# Patient Record
Sex: Male | Born: 1960 | Race: White | Hispanic: No | Marital: Married | Smoking: Former smoker
Health system: Southern US, Community
[De-identification: ages and names within clinical notes are randomized; demographics above are authoritative.]

## PROBLEM LIST (undated history)

## (undated) DIAGNOSIS — R202 Paresthesia of skin: Secondary | ICD-10-CM

## (undated) DIAGNOSIS — R200 Anesthesia of skin: Secondary | ICD-10-CM

## (undated) HISTORY — PX: SHOULDER ARTHROSCOPY: SHX128

---

## 2003-04-24 ENCOUNTER — Encounter: Admission: RE | Admit: 2003-04-24 | Discharge: 2003-04-24 | Payer: Self-pay | Admitting: Sports Medicine

## 2005-06-13 ENCOUNTER — Emergency Department (HOSPITAL_COMMUNITY): Admission: EM | Admit: 2005-06-13 | Discharge: 2005-06-13 | Payer: Self-pay | Admitting: Emergency Medicine

## 2015-05-23 ENCOUNTER — Other Ambulatory Visit: Payer: Self-pay | Admitting: Orthopaedic Surgery

## 2015-05-23 DIAGNOSIS — M25512 Pain in left shoulder: Secondary | ICD-10-CM

## 2015-06-02 ENCOUNTER — Ambulatory Visit
Admission: RE | Admit: 2015-06-02 | Discharge: 2015-06-02 | Disposition: A | Discharge status: Home / Self Care | Payer: BLUE CROSS/BLUE SHIELD | Source: Ambulatory Visit | Attending: Orthopaedic Surgery | Admitting: Orthopaedic Surgery

## 2015-06-02 DIAGNOSIS — M25512 Pain in left shoulder: Secondary | ICD-10-CM

## 2017-10-24 ENCOUNTER — Other Ambulatory Visit: Payer: Self-pay | Admitting: Urology

## 2017-10-24 DIAGNOSIS — C61 Malignant neoplasm of prostate: Secondary | ICD-10-CM

## 2017-11-08 ENCOUNTER — Encounter (HOSPITAL_COMMUNITY)
Admission: RE | Admit: 2017-11-08 | Discharge: 2017-11-08 | Disposition: A | Discharge status: Home / Self Care | Payer: No Typology Code available for payment source | Source: Ambulatory Visit | Attending: Urology | Admitting: Urology

## 2017-11-08 DIAGNOSIS — C61 Malignant neoplasm of prostate: Secondary | ICD-10-CM

## 2017-11-08 MED ORDER — TECHNETIUM TC 99M MEDRONATE IV KIT
19.5000 | PACK | Freq: Once | INTRAVENOUS | Status: AC | PRN
  Administered 2017-11-08: 19.5 via INTRAVENOUS

## 2017-11-23 ENCOUNTER — Other Ambulatory Visit: Payer: Self-pay | Admitting: Urology

## 2018-01-04 NOTE — Patient Instructions (Addendum)
SALEM LEMBKE  01/04/2018   Your procedure is scheduled on: 01-11-18   Report to [facility] Main  Entrance    Report to admitting at 5:30AM    Call this number if you have problems the morning of surgery [phone]     Remember: Do not eat food or drink liquids :After Midnight.     Take these medicines the morning of surgery with A SIP OF WATER: none                                You may not have any metal on your body including hair pins and              piercings  Do not wear jewelry, make-up, lotions, powders or perfumes, deodorant                           Men may shave face and neck.   Do not bring valuables to the hospital. [place].  Contacts, dentures or bridgework may not be worn into surgery.  Leave suitcase in the car. After surgery it may be brought to your room.                 Please read over the following fact sheets you were given: _____________________________________________________________________             [facility] - Preparing for Surgery Before surgery, you can play an important role.  Because skin is not sterile, your skin needs to be as free of germs as possible.  You can reduce the number of germs on your skin by washing with CHG (chlorahexidine gluconate) soap before surgery.  CHG is an antiseptic cleaner which kills germs and bonds with the skin to continue killing germs even after washing. Please DO NOT use if you have an allergy to CHG or antibacterial soaps.  If your skin becomes reddened/irritated stop using the CHG and inform your nurse when you arrive at Short Stay. Do not shave (including legs and underarms) for at least 48 hours prior to the first CHG shower.  You may shave your face/neck. Please follow these instructions carefully:  1.  Shower with CHG Soap the night before surgery and the  morning of Surgery.  2.  If you choose to wash your hair, wash your  hair first as usual with your  normal  shampoo.  3.  After you shampoo, rinse your hair and body thoroughly to remove the  shampoo.                           4.  Use CHG as you would any other liquid soap.  You can apply chg directly  to the skin and wash                       Gently with a scrungie or clean washcloth.  5.  Apply the CHG Soap to your body ONLY FROM THE NECK DOWN.   Do not use on face/ open  Wound or open sores. Avoid contact with eyes, ears mouth and genitals (private parts).                       Wash face,  Genitals (private parts) with your normal soap.             6.  Wash thoroughly, paying special attention to the area where your surgery  will be performed.  7.  Thoroughly rinse your body with warm water from the neck down.  8.  DO NOT shower/wash with your normal soap after using and rinsing off  the CHG Soap.                9.  Pat yourself dry with a clean towel.            10.  Wear clean pajamas.            11.  Place clean sheets on your bed the night of your first shower and do not  sleep with pets. Day of Surgery : Do not apply any lotions/deodorants the morning of surgery.  Please wear clean clothes to the hospital/surgery center.  FAILURE TO FOLLOW THESE INSTRUCTIONS MAY RESULT IN THE CANCELLATION OF YOUR SURGERY PATIENT SIGNATURE_________________________________  NURSE SIGNATURE__________________________________  ________________________________________________________________________

## 2018-01-05 ENCOUNTER — Encounter (HOSPITAL_COMMUNITY)
Admission: RE | Admit: 2018-01-05 | Discharge: 2018-01-05 | Disposition: A | Discharge status: Home / Self Care | Payer: No Typology Code available for payment source | Source: Ambulatory Visit | Attending: Urology | Admitting: Urology

## 2018-01-05 ENCOUNTER — Other Ambulatory Visit: Payer: Self-pay

## 2018-01-05 ENCOUNTER — Encounter (HOSPITAL_COMMUNITY): Payer: Self-pay

## 2018-01-05 DIAGNOSIS — Z01812 Encounter for preprocedural laboratory examination: Secondary | ICD-10-CM | POA: Diagnosis not present

## 2018-01-05 HISTORY — DX: Anesthesia of skin: R20.0

## 2018-01-05 HISTORY — DX: Paresthesia of skin: R20.2

## 2018-01-05 LAB — COMPREHENSIVE METABOLIC PANEL
ALBUMIN: 4.6 g/dL (ref 3.5–5.0)
ALK PHOS: 58 U/L (ref 38–126)
ALT: 27 U/L (ref 17–63)
AST: 25 U/L (ref 15–41)
Anion gap: 8 (ref 5–15)
BILIRUBIN TOTAL: 0.5 mg/dL (ref 0.3–1.2)
BUN: 14 mg/dL (ref 6–20)
CALCIUM: 9.8 mg/dL (ref 8.9–10.3)
CO2: 27 mmol/L (ref 22–32)
CREATININE: 0.96 mg/dL (ref 0.61–1.24)
Chloride: 106 mmol/L (ref 101–111)
GFR calc Af Amer: >60 mL/min (ref >60)
GFR calc non Af Amer: >60 mL/min (ref >60)
GLUCOSE: 99 mg/dL (ref 65–99)
Potassium: 4.3 mmol/L (ref 3.5–5.1)
Sodium: 141 mmol/L (ref 135–145)
Total Protein: 7.2 g/dL (ref 6.5–8.1)

## 2018-01-05 LAB — CBC
HEMATOCRIT: 42.1 % (ref 39.0–52.0)
HEMOGLOBIN: 14.4 g/dL (ref 13.0–17.0)
MCH: 31 pg (ref 26.0–34.0)
MCHC: 34.2 g/dL (ref 30.0–36.0)
MCV: 90.5 fL (ref 78.0–100.0)
Platelets: 219 10*3/uL (ref 150–400)
RBC: 4.65 MIL/uL (ref 4.22–5.81)
RDW: 13.5 % (ref 11.5–15.5)
WBC: 6 10*3/uL (ref 4.0–10.5)

## 2018-01-05 LAB — ABO/RH: ABO/RH(D): O NEG

## 2018-01-06 LAB — URINE CULTURE: Culture: NO GROWTH

## 2018-01-10 NOTE — Anesthesia Preprocedure Evaluation (Signed)
Anesthesia Evaluation  Patient identified by MRN, date of birth, ID band Patient awake    Reviewed: Allergy & Precautions, H&P , NPO status , Patient's Chart, lab work & pertinent test results, reviewed documented beta blocker date and time   Airway Mallampati: II  TM Distance: >3 FB Neck ROM: full    Dental no notable dental hx.    Pulmonary former smoker,    Pulmonary exam normal breath sounds clear to auscultation       Cardiovascular Exercise Tolerance: Good negative cardio ROS   Rhythm:regular Rate:Normal     Neuro/Psych    GI/Hepatic   Endo/Other    Renal/GU      Musculoskeletal   Abdominal   Peds  Hematology   Anesthesia Other Findings   Reproductive/Obstetrics negative OB ROS                             Anesthesia Physical Anesthesia Plan  ASA: II  Anesthesia Plan: General   Post-op Pain Management:    Induction:   PONV Risk Score and Plan:   Airway Management Planned:   Additional Equipment:   Intra-op Plan:   Post-operative Plan:   Informed Consent: I have reviewed the patients History and Physical, chart, labs and discussed the procedure including the risks, benefits and alternatives for the proposed anesthesia with the patient or authorized representative who has indicated his/her understanding and acceptance.   Dental Advisory Given  Plan Discussed with: CRNA  Anesthesia Plan Comments:         Anesthesia Quick Evaluation

## 2018-01-11 ENCOUNTER — Observation Stay (HOSPITAL_COMMUNITY)
Admission: RE | Admit: 2018-01-11 | Discharge: 2018-01-12 | Disposition: A | Discharge status: Home / Self Care | Payer: No Typology Code available for payment source | Source: Ambulatory Visit | Attending: Urology | Admitting: Urology

## 2018-01-11 ENCOUNTER — Encounter (HOSPITAL_COMMUNITY): Payer: Self-pay | Admitting: Urology

## 2018-01-11 ENCOUNTER — Ambulatory Visit (HOSPITAL_COMMUNITY): Payer: No Typology Code available for payment source | Admitting: Anesthesiology

## 2018-01-11 ENCOUNTER — Encounter (HOSPITAL_COMMUNITY)
Admission: RE | Disposition: A | Discharge status: Home / Self Care | Payer: Self-pay | Source: Ambulatory Visit | Attending: Urology

## 2018-01-11 DIAGNOSIS — C61 Malignant neoplasm of prostate: Secondary | ICD-10-CM | POA: Diagnosis not present

## 2018-01-11 DIAGNOSIS — Z87891 Personal history of nicotine dependence: Secondary | ICD-10-CM | POA: Insufficient documentation

## 2018-01-11 DIAGNOSIS — R972 Elevated prostate specific antigen [PSA]: Secondary | ICD-10-CM | POA: Diagnosis present

## 2018-01-11 DIAGNOSIS — C775 Secondary and unspecified malignant neoplasm of intrapelvic lymph nodes: Secondary | ICD-10-CM | POA: Diagnosis not present

## 2018-01-11 HISTORY — PX: ROBOT ASSISTED LAPAROSCOPIC RADICAL PROSTATECTOMY: SHX5141

## 2018-01-11 HISTORY — PX: LYMPHADENECTOMY: SHX5960

## 2018-01-11 LAB — HEMOGLOBIN AND HEMATOCRIT, BLOOD
HCT: 42.9 % (ref 39.0–52.0)
HEMOGLOBIN: 14.3 g/dL (ref 13.0–17.0)

## 2018-01-11 LAB — TYPE AND SCREEN
ABO/RH(D): O NEG
Antibody Screen: NEGATIVE

## 2018-01-11 SURGERY — PROSTATECTOMY, RADICAL, ROBOT-ASSISTED, LAPAROSCOPIC
Anesthesia: General

## 2018-01-11 MED ORDER — PROPOFOL 10 MG/ML IV BOLUS
INTRAVENOUS | Status: AC
  Filled 2018-01-11: qty 20

## 2018-01-11 MED ORDER — MEPERIDINE HCL 50 MG/ML IJ SOLN
INTRAMUSCULAR | Status: AC
  Administered 2018-01-11: 12.5 mg via INTRAVENOUS
  Filled 2018-01-11: qty 1

## 2018-01-11 MED ORDER — ACETAMINOPHEN 160 MG/5ML PO SOLN: 325.0000 mg | ORAL | Status: DC | PRN

## 2018-01-11 MED ORDER — SODIUM CHLORIDE 0.9 % IJ SOLN
INTRAMUSCULAR | Status: AC
  Filled 2018-01-11: qty 50

## 2018-01-11 MED ORDER — BUPIVACAINE-EPINEPHRINE 0.5% -1:200000 IJ SOLN
INTRAMUSCULAR | Status: DC | PRN
  Administered 2018-01-11: 30 mL

## 2018-01-11 MED ORDER — ONDANSETRON HCL 4 MG/2ML IJ SOLN
INTRAMUSCULAR | Status: DC | PRN
  Administered 2018-01-11: 4 mg via INTRAVENOUS

## 2018-01-11 MED ORDER — TRAMADOL HCL 50 MG PO TABS: 50.0000 mg | ORAL_TABLET | Freq: Four times a day (QID) | ORAL | 0 refills | Status: DC | PRN

## 2018-01-11 MED ORDER — PROPOFOL 10 MG/ML IV BOLUS
INTRAVENOUS | Status: DC | PRN
  Administered 2018-01-11: 40 mg via INTRAVENOUS
  Administered 2018-01-11: 160 mg via INTRAVENOUS

## 2018-01-11 MED ORDER — FENTANYL CITRATE (PF) 250 MCG/5ML IJ SOLN
INTRAMUSCULAR | Status: AC
  Filled 2018-01-11: qty 5

## 2018-01-11 MED ORDER — FENTANYL CITRATE (PF) 100 MCG/2ML IJ SOLN
INTRAMUSCULAR | Status: DC | PRN
  Administered 2018-01-11: 25 ug via INTRAVENOUS
  Administered 2018-01-11 (×2): 50 ug via INTRAVENOUS
  Administered 2018-01-11: 25 ug via INTRAVENOUS
  Administered 2018-01-11 (×3): 50 ug via INTRAVENOUS

## 2018-01-11 MED ORDER — LACTATED RINGERS IV SOLN
INTRAVENOUS | Status: DC | PRN
  Administered 2018-01-11 (×2): via INTRAVENOUS

## 2018-01-11 MED ORDER — DIPHENHYDRAMINE HCL 12.5 MG/5ML PO ELIX: 12.5000 mg | ORAL_SOLUTION | Freq: Four times a day (QID) | ORAL | Status: DC | PRN

## 2018-01-11 MED ORDER — BUPIVACAINE LIPOSOME 1.3 % IJ SUSP
20.0000 mL | Freq: Once | INTRAMUSCULAR | Status: AC
  Administered 2018-01-11: 20 mL
  Filled 2018-01-11: qty 20

## 2018-01-11 MED ORDER — MIDAZOLAM HCL 5 MG/5ML IJ SOLN
INTRAMUSCULAR | Status: DC | PRN
  Administered 2018-01-11: 2 mg via INTRAVENOUS

## 2018-01-11 MED ORDER — ONDANSETRON HCL 4 MG/2ML IJ SOLN
INTRAMUSCULAR | Status: AC
  Filled 2018-01-11: qty 2

## 2018-01-11 MED ORDER — BUPIVACAINE-EPINEPHRINE 0.5% -1:200000 IJ SOLN
INTRAMUSCULAR | Status: AC
  Filled 2018-01-11: qty 1

## 2018-01-11 MED ORDER — DEXAMETHASONE SODIUM PHOSPHATE 10 MG/ML IJ SOLN
INTRAMUSCULAR | Status: DC | PRN
  Administered 2018-01-11: 10 mg via INTRAVENOUS

## 2018-01-11 MED ORDER — FENTANYL CITRATE (PF) 100 MCG/2ML IJ SOLN
INTRAMUSCULAR | Status: AC
  Filled 2018-01-11: qty 2

## 2018-01-11 MED ORDER — ACETAMINOPHEN 10 MG/ML IV SOLN
1000.0000 mg | Freq: Four times a day (QID) | INTRAVENOUS | Status: AC
  Administered 2018-01-11 – 2018-01-12 (×3): 1000 mg via INTRAVENOUS
  Filled 2018-01-11 (×4): qty 100

## 2018-01-11 MED ORDER — SODIUM CHLORIDE 0.9 % IV BOLUS: 1000.0000 mL | Freq: Once | INTRAVENOUS | Status: DC

## 2018-01-11 MED ORDER — ACETAMINOPHEN 325 MG PO TABS: 650.0000 mg | ORAL_TABLET | ORAL | Status: DC | PRN

## 2018-01-11 MED ORDER — ACETAMINOPHEN 325 MG PO TABS: 325.0000 mg | ORAL_TABLET | ORAL | Status: DC | PRN

## 2018-01-11 MED ORDER — LIDOCAINE 2% (20 MG/ML) 5 ML SYRINGE
INTRAMUSCULAR | Status: AC
  Filled 2018-01-11: qty 5

## 2018-01-11 MED ORDER — STERILE WATER FOR IRRIGATION IR SOLN
Status: DC | PRN
  Administered 2018-01-11: 1000 mL

## 2018-01-11 MED ORDER — SULFAMETHOXAZOLE-TRIMETHOPRIM 800-160 MG PO TABS: 1.0000 | ORAL_TABLET | Freq: Two times a day (BID) | ORAL | 0 refills | Status: DC

## 2018-01-11 MED ORDER — SUGAMMADEX SODIUM 200 MG/2ML IV SOLN
INTRAVENOUS | Status: DC | PRN
  Administered 2018-01-11: 160 mg via INTRAVENOUS

## 2018-01-11 MED ORDER — SUGAMMADEX SODIUM 200 MG/2ML IV SOLN
INTRAVENOUS | Status: AC
  Filled 2018-01-11: qty 2

## 2018-01-11 MED ORDER — DIPHENHYDRAMINE HCL 50 MG/ML IJ SOLN: 12.5000 mg | Freq: Four times a day (QID) | INTRAMUSCULAR | Status: DC | PRN

## 2018-01-11 MED ORDER — BUPIVACAINE-EPINEPHRINE (PF) 0.5% -1:200000 IJ SOLN
INTRAMUSCULAR | Status: AC
  Filled 2018-01-11: qty 30

## 2018-01-11 MED ORDER — LACTATED RINGERS IR SOLN
Status: DC | PRN
  Administered 2018-01-11: 1

## 2018-01-11 MED ORDER — ONDANSETRON HCL 4 MG/2ML IJ SOLN: 4.0000 mg | INTRAMUSCULAR | Status: DC | PRN

## 2018-01-11 MED ORDER — DEXTROSE-NACL 5-0.45 % IV SOLN
INTRAVENOUS | Status: DC
  Administered 2018-01-11 – 2018-01-12 (×3): via INTRAVENOUS

## 2018-01-11 MED ORDER — KETOROLAC TROMETHAMINE 15 MG/ML IJ SOLN
15.0000 mg | Freq: Four times a day (QID) | INTRAMUSCULAR | Status: DC
  Administered 2018-01-11 – 2018-01-12 (×5): 15 mg via INTRAVENOUS
  Filled 2018-01-11 (×5): qty 1

## 2018-01-11 MED ORDER — ROCURONIUM BROMIDE 10 MG/ML (PF) SYRINGE
PREFILLED_SYRINGE | INTRAVENOUS | Status: DC | PRN
  Administered 2018-01-11: 20 mg via INTRAVENOUS
  Administered 2018-01-11 (×3): 10 mg via INTRAVENOUS
  Administered 2018-01-11: 60 mg via INTRAVENOUS
  Administered 2018-01-11: 10 mg via INTRAVENOUS
  Administered 2018-01-11: 20 mg via INTRAVENOUS
  Administered 2018-01-11: 10 mg via INTRAVENOUS

## 2018-01-11 MED ORDER — CEFAZOLIN SODIUM-DEXTROSE 2-4 GM/100ML-% IV SOLN
INTRAVENOUS | Status: AC
  Filled 2018-01-11: qty 100

## 2018-01-11 MED ORDER — FENTANYL CITRATE (PF) 100 MCG/2ML IJ SOLN
25.0000 ug | INTRAMUSCULAR | Status: DC | PRN
  Administered 2018-01-11: 50 ug via INTRAVENOUS

## 2018-01-11 MED ORDER — LACTATED RINGERS IV SOLN: INTRAVENOUS | Status: DC

## 2018-01-11 MED ORDER — ROCURONIUM BROMIDE 100 MG/10ML IV SOLN
INTRAVENOUS | Status: AC
  Filled 2018-01-11: qty 1

## 2018-01-11 MED ORDER — TRAMADOL HCL 50 MG PO TABS
50.0000 mg | ORAL_TABLET | Freq: Four times a day (QID) | ORAL | Status: DC | PRN
Start: 2018-01-11 — End: 2018-01-12
  Administered 2018-01-11 – 2018-01-12 (×2): 100 mg via ORAL
  Filled 2018-01-11 (×2): qty 2

## 2018-01-11 MED ORDER — ONDANSETRON HCL 4 MG/2ML IJ SOLN: 4.0000 mg | Freq: Once | INTRAMUSCULAR | Status: DC | PRN

## 2018-01-11 MED ORDER — OXYCODONE HCL 5 MG/5ML PO SOLN
5.0000 mg | Freq: Once | ORAL | Status: DC | PRN
  Filled 2018-01-11: qty 5

## 2018-01-11 MED ORDER — MEPERIDINE HCL 50 MG/ML IJ SOLN
6.2500 mg | INTRAMUSCULAR | Status: DC | PRN
  Administered 2018-01-11: 12.5 mg via INTRAVENOUS

## 2018-01-11 MED ORDER — CEFAZOLIN SODIUM-DEXTROSE 2-4 GM/100ML-% IV SOLN
2.0000 g | INTRAVENOUS | Status: AC
  Administered 2018-01-11 (×2): 2 g via INTRAVENOUS
  Filled 2018-01-11: qty 100

## 2018-01-11 MED ORDER — LIDOCAINE 2% (20 MG/ML) 5 ML SYRINGE
INTRAMUSCULAR | Status: DC | PRN
  Administered 2018-01-11: 80 mg via INTRAVENOUS

## 2018-01-11 MED ORDER — OXYCODONE HCL 5 MG PO TABS: 5.0000 mg | ORAL_TABLET | Freq: Once | ORAL | Status: DC | PRN

## 2018-01-11 MED ORDER — HYDROMORPHONE HCL 1 MG/ML IJ SOLN
0.5000 mg | INTRAMUSCULAR | Status: DC | PRN
  Administered 2018-01-11: 1 mg via INTRAVENOUS
  Filled 2018-01-11 (×2): qty 1

## 2018-01-11 MED ORDER — DEXAMETHASONE SODIUM PHOSPHATE 10 MG/ML IJ SOLN
INTRAMUSCULAR | Status: AC
  Filled 2018-01-11: qty 1

## 2018-01-11 MED ORDER — MIDAZOLAM HCL 2 MG/2ML IJ SOLN
INTRAMUSCULAR | Status: AC
  Filled 2018-01-11: qty 2

## 2018-01-11 SURGICAL SUPPLY — 52 items
CATH FOLEY 2WAY SLVR 18FR 30CC (CATHETERS) ×4 IMPLANT
CATH TIEMANN FOLEY 18FR 5CC (CATHETERS) ×4 IMPLANT
CHLORAPREP W/TINT 26ML (MISCELLANEOUS) ×4 IMPLANT
CLIP VESOLOCK LG 6/CT PURPLE (CLIP) ×16 IMPLANT
COVER SURGICAL LIGHT HANDLE (MISCELLANEOUS) ×4 IMPLANT
COVER TIP SHEARS 8 DVNC (MISCELLANEOUS) ×4 IMPLANT
COVER TIP SHEARS 8MM DA VINCI (MISCELLANEOUS) ×4
CUTTER ECHEON FLEX ENDO 45 340 (ENDOMECHANICALS) ×4 IMPLANT
DECANTER SPIKE VIAL GLASS SM (MISCELLANEOUS) ×4 IMPLANT
DERMABOND ADVANCED (GAUZE/BANDAGES/DRESSINGS) ×2
DERMABOND ADVANCED .7 DNX12 (GAUZE/BANDAGES/DRESSINGS) ×2 IMPLANT
DRAPE ARM DVNC X/XI (DISPOSABLE) ×8 IMPLANT
DRAPE COLUMN DVNC XI (DISPOSABLE) ×2 IMPLANT
DRAPE DA VINCI XI ARM (DISPOSABLE) ×8
DRAPE DA VINCI XI COLUMN (DISPOSABLE) ×2
DRAPE SURG IRRIG POUCH 19X23 (DRAPES) ×4 IMPLANT
DRSG TEGADERM 4X4.75 (GAUZE/BANDAGES/DRESSINGS) ×4 IMPLANT
ELECT REM PT RETURN 15FT ADLT (MISCELLANEOUS) ×4 IMPLANT
GAUZE SPONGE 2X2 8PLY STRL LF (GAUZE/BANDAGES/DRESSINGS) ×2 IMPLANT
GLOVE BIO SURGEON STRL SZ 6.5 (GLOVE) ×3 IMPLANT
GLOVE BIO SURGEONS STRL SZ 6.5 (GLOVE) ×1
GLOVE BIOGEL M STRL SZ7.5 (GLOVE) ×8 IMPLANT
GOWN STRL REUS W/TWL LRG LVL3 (GOWN DISPOSABLE) ×4 IMPLANT
GOWN STRL REUS W/TWL XL LVL3 (GOWN DISPOSABLE) ×8 IMPLANT
HOLDER FOLEY CATH W/STRAP (MISCELLANEOUS) ×4 IMPLANT
IRRIG SUCT STRYKERFLOW 2 WTIP (MISCELLANEOUS) ×4
IRRIGATION SUCT STRKRFLW 2 WTP (MISCELLANEOUS) ×2 IMPLANT
IV LACTATED RINGERS 1000ML (IV SOLUTION) ×4 IMPLANT
PACK ROBOT UROLOGY CUSTOM (CUSTOM PROCEDURE TRAY) ×4 IMPLANT
PAD POSITIONING PINK XL (MISCELLANEOUS) ×4 IMPLANT
PORT ACCESS TROCAR AIRSEAL 12 (TROCAR) ×2 IMPLANT
PORT ACCESS TROCAR AIRSEAL 5M (TROCAR) ×2
SEAL CANN UNIV 5-8 DVNC XI (MISCELLANEOUS) ×8 IMPLANT
SEAL XI 5MM-8MM UNIVERSAL (MISCELLANEOUS) ×8
SET TRI-LUMEN FLTR TB AIRSEAL (TUBING) ×4 IMPLANT
SOLUTION ELECTROLUBE (MISCELLANEOUS) ×4 IMPLANT
SPONGE GAUZE 2X2 STER 10/PKG (GAUZE/BANDAGES/DRESSINGS) ×2
STAPLE RELOAD 45 GRN (STAPLE) ×2 IMPLANT
STAPLE RELOAD 45MM GREEN (STAPLE) ×2
SUT ETHILON 3 0 PS 1 (SUTURE) ×4 IMPLANT
SUT MNCRL AB 4-0 PS2 18 (SUTURE) ×8 IMPLANT
SUT V-LOC BARB 180 2/0GR6 GS22 (SUTURE) ×4
SUT VIC AB 0 CT1 27 (SUTURE) ×2
SUT VIC AB 0 CT1 27XBRD ANTBC (SUTURE) ×2 IMPLANT
SUT VIC AB 2-0 SH 27 (SUTURE) ×2
SUT VIC AB 2-0 SH 27X BRD (SUTURE) ×2 IMPLANT
SUT VICRYL 0 UR6 27IN ABS (SUTURE) ×8 IMPLANT
SUT VLOC BARB 180 ABS3/0GR12 (SUTURE) ×8
SUTURE V-LC BRB 180 2/0GR6GS22 (SUTURE) ×2 IMPLANT
SUTURE VLOC BRB 180 ABS3/0GR12 (SUTURE) ×4 IMPLANT
TOWEL OR NON WOVEN STRL DISP B (DISPOSABLE) ×4 IMPLANT
WATER STERILE IRR 1000ML POUR (IV SOLUTION) ×4 IMPLANT

## 2018-01-11 NOTE — Anesthesia Postprocedure Evaluation (Signed)
Anesthesia Post Note  Patient: TAKERU BOSE  Procedure(s) Performed: XI ROBOTIC ASSISTED LAPAROSCOPIC RADICAL PROSTATECTOMY (N/A ) LYMPHADENECTOMY (Bilateral )     Patient location during evaluation: PACU Anesthesia Type: General Level of consciousness: awake and alert Pain management: pain level controlled Vital Signs Assessment: post-procedure vital signs reviewed and stable Respiratory status: spontaneous breathing, nonlabored ventilation, respiratory function stable and patient connected to nasal cannula oxygen Cardiovascular status: blood pressure returned to baseline and stable Postop Assessment: no apparent nausea or vomiting Anesthetic complications: no    Last Vitals:  Vitals:   01/11/18 1300 01/11/18 1315  BP: (!) 146/84 (!) 148/86  Pulse: 92 91  Resp: 12 11  Temp:  36.8 C  SpO2: 100% 100%    Last Pain:  Vitals:   01/11/18 1315  TempSrc:   PainSc: Asleep                 Natalyn Szymanowski

## 2018-01-11 NOTE — Plan of Care (Signed)

## 2018-01-11 NOTE — Op Note (Signed)
Preoperative diagnosis:  1. Prostate Cancer   Postoperative diagnosis:  1. same   Procedure: 1. Robotic assisted laparoscopic radical prostatectomy 2. Bilateral extended pelvic lymph node dissection  Surgeon: Ardis Hughs, MD First Assistant: Debbrah Alar, PA Resident Assistant: Case Clydene Laming, MD  Anesthesia: General  Complications: None  Intraoperative findings:  1. Non-nerve sparing 2. Extended node dissection including common/external/internal and obturator node packets bilaterally 3. Water tight bladder anastamosis  EBL: Minimal  Specimens:  #1.  Prostate and seminal vesicals #2.  Bilateral common iliac lymph nodes, external iliac lymph nodes, internal iliac lymph nodes, obturator lymph nodes.  Indication: Jeffery Herrera is a 57 y.o. patient with prostate cancer.  After reviewing the management options for treatment, he elected to proceed with the removal of his prostate. We have discussed the potential benefits and risks of the procedure, side effects of the proposed treatment, the likelihood of the patient achieving the goals of the procedure, and any potential problems that might occur during the procedure or recuperation. Informed consent has been obtained.  Description of procedure: An assistant was required for this surgical procedure.  The duties of the assistant included but were not limited to suctioning, passing suture, camera manipulation, retraction. This procedure would not be able to be performed without an Environmental consultant.  The patient was consented in the preoperative holding area. He was in brought back to the operating room placed the table in supine position. General anesthesia was then induced and endotracheal tube was inserted. He was then placed in dorsolithotomy position and placed in steep Trendelenburg. He was then prepped and draped in the routine sterile fashion. We, the first assistant and I, then began by making a 10 mm incision supraumbilical midline  incision the skin down through into the peritoneum. Then placed a 8 mm trocar. I then inflated the abdomen and inserted the 0 robotic lens. We then placed 2 additional a 8 millimeter trochars in the patient's left lower abdomen proximally 9 cm apart and 2 trochars on the patient's right lower abdomen, one was an 8 mm trocar and the one most lateral was a 12 mm trocar which was used as the assistant port. A 5 mm trocar was placed by triangulating the 2 right lateral ports as a second assistant port. These ports were all placed under visual guidance. Once the ports were noted to be satisfactory position the robot was docked. We started with the 0 lens, monopolar scissors in the right hand and the [place] forceps the left hand as well as a fenestrated grasper as the third arm on the left-hand side.   We, the first assistant and I,  began our dissection the posterior plane incising the peritoneum at the level of the vas deferens. Isolated the left vas deferens and dissected it proximally towards the spermatic cord for 5 cm prior to ligating it. Then used this as traction to isolate the left the seminal vesicle which was then undressed bluntly and completely dissected out, all vessels were cauterized with a combination of bipolar and the monopolar scissors. We then turned our attention to the right side and similarly dissected out the right vas deferens and seminal vesicle. Once the SVs had been freed, we turned our attention to the posterior plane and bluntly dissected the tissue between the rectum and the posterior wall of the prostate bluntly out towards the apex.    At this point the bladder was taken down starting at the urachal remnant with a combination of both blunt  dissection and sharp dissection using monopolar cautery the bladder was dropped down in the usual fashion to the medial umbilical ligaments laterally and the dorsal vein of the prostate anteriorly creating our space of Retzius. We then turned  our attention to the endopelvic fascia which was incised laterally starting on the patient's right-hand side the levator muscles were pushed off the prostate laterally up towards the dorsal vein complex on the right-hand side. This process was then repeated on the left-hand side and a nice notch was created for the dorsal vein. I then used a 9mm stapler to staple the dorsal vein.   We,the first assistant and I, then located the bladder neck at the vesicoprostatic junction and using the monopolar scissors dissected down through the perivesical tissues and the bladder neck down to the prostatic urethra. The catheter was then deflated and pulled through our urethral opening and then used to retract the prostate anteriorly for the posterior bladder neck dissection. Once through the bladder neck and into the posterior plane of the prostate, the SVs were brought through the opening. The left pedicle was then isolated and systematically ligated with Weck clips and scissors. The nerves were not spared.   I then came down through the dorsal venous complex anteriorly down to the membranous urethra using the monopolar. Once down to the urethra, it was transected sharply and the apex of the prostate was then dissected off the levator and rectourethralis muscles. Once the apex of the prostate had been dissected free we came back to the base of the prostate and bluntly push the rectum and nerve vascular bundle off the prostate the patient's left and used clips on the patient's right to free the prostate. Once the prostate was free it was placed off to the side. The pelvis was then irrigated with normal saline and noted to be relatively hemostatic.  Attention was then turned to the right pelvic sidewall. The fibrofatty tissue between the external iliac vein, confluence of the iliac vessels, hypogastric artery, and Cooper's ligament was dissected free from the pelvic sidewall with care to preserve the obturator nerve. Weck  clips were used for lymphostasis and hemostasis.  The dissection was carried proximally to the common iliac artery at the bifurcation.  Individual lymph node packets were sent from each segment as noted above.   An identical procedure was performed on the contralateral side and the lymphatic packets were removed for permanent pathologic analysis.  The prostate and both lymph node tissues were placed in the Endo Catch bag and the string brought to the 5 mm port.    The vesicourethral anastomosis was then completed with 2 interlocking 3-0 V. lock sutures running the anastomosis in the 6:00 position to the 12:00 position on each side and then tying it off on the top. The final catheter was then passed through the patient's urethra and into the bladder and 120 cc was instilled into the bladder to test the anastomosis. As there was no leak a 64 [place] Blake drain was passed through the left lateral port and placed around the vesicourethral anastomosis. A 12 mm assistant port on the right lateral side was then closed with 0 Vicryl with the help of the [employer] needle. The 12 mm midline infraumbilical incision was then extended another centimeter taken down and the fascia opened to remove the Endo Catch bag with the prostate specimen. The fascia was then closed with a 0 Vicryl and all skin ports were closed with 4-0 Monocryl in a  subcutaneous fashion. Dermabond glue was then applied to the incisions. The drain was then secured to the skin with a 0 nylon stitch and dressing applied.   At the end of the case all laps needles and sponges had been accounted for. There no immediate complications. The patient returned to the PACU in stable condition.

## 2018-01-11 NOTE — Discharge Instructions (Signed)

## 2018-01-11 NOTE — H&P (Signed)
Elevated PSA- Established Patient  HPI: Jeffery Herrera is a 57 year-old male established patient who is here for follow up for further evaluation of his elevated PSA.  The patient was last seen 12/18.   His last PSA was performed approximately 10/03/2017. The last PSA value was 12.4.   His previous PSA(s) are: 12/18: 9.7. The patient states he does not take 5 alpha reductase inhibitor medication.   The patient complains of recent onset or worsening of his lower urinary tract symptom(s) that include frequency, urgency, and nocturia. He takes Tamsulosin for his lower urinary tract symptoms.   The patient has not had a prostate MRI. He has not had a prostate biopsy done. The patient does not have a family history of prostate cancer.   Patient does Crossfit training and states that while on the medication this cause his blood pressure to get too low and cause dizziness, therefore he stopped flomax. However, he did notice a improvement in his urinary tract symptoms. Patient states he drinks a lot of fluid during the day which may contribute to his frequency.   Prostate cancer profile  Stage: T2c  PSA: 12.4  Biopsy Gleason 4+4 = 8 , 12 /12 cores positive:  Prostate volume: 42gm   Prostate cancer nomogram after radical prostatectomy:  OC- 20%  ECE- 84%  SVI-  LNI - 20%  PFS (surgery)- 22% (60yr); 14% (22yr)  CSFS (surgery) - 99% (10 years) 97% (15 years)   Pelvic CT scan 4/19: No evidence of locally advanced disease, lymphadenopathy, or bony metastasis.  Bone scan 4/19: Minimal contrast uptake on the right fourth posterior rib. No other areas of uptake.   Interval: The patient presents today for discussion of his prostate cancer. He was diagnosed with high-volume Gleason 8 prostate cancer. Fortunately his bone scan and pelvic CT scan were largely unremarkable. There was an area of minimal contrast uptake on the bone scan in the posterior right fourth rib. A chest x-ray is pending.   The  patient notes slight improvement with his voiding symptoms on Uroxatral, but he is tolerating this without lightheadedness. He noted that her symptoms on tamsulosin, but was not tolerating lightheadedness.   The patient has a past surgical history of bilateral shoulder reconstruction, skin cancer removal on his neck. He is otherwise quite fit with no abdominal surgeries. He takes no medications.     AUA Symptom Score: He never has the sensation of not emptying his bladder completely after finishing urinating. Less than 20% of the time he has to urinate again fewer than two hours after he has finished urinating. He does not have to stop and start again several times when he urinates. Less than 20% of the time he finds it difficult to postpone urination. He never has a weak urinary stream. He never has to push or strain to begin urination. He has to get up to urinate 2 times from the time he goes to bed until the time he gets up in the morning.   Calculated AUA Symptom Score: 4    ALLERGIES: None   MEDICATIONS: Tamsulosin Hcl 0.4 mg capsule 1 capsule PO Daily  Uroxatral 10 mg tablet, extended release 24 hr 1 tablet PO Daily     GU PSH: Prostate Needle Biopsy - 10/17/2017    NON-GU PSH: Shoulder Surgery (Unspecified), Bilateral Surgical Pathology, Gross And Microscopic Examination For Prostate Needle - 10/17/2017    GU PMH: Elevated PSA - 10/17/2017, - 08/16/2017 Urinary Obstruction (Stable) - 08/16/2017, -  06/29/2017 ED due to arterial insufficiency - 06/29/2017 Encounter for Prostate Cancer screening - 06/29/2017 Prostate nodule w/ LUTS - 06/29/2017    NON-GU PMH: None   FAMILY HISTORY: 1 Daughter - Daughter 2 sons - Son   SOCIAL HISTORY: Marital Status: Married Preferred Language: English; Ethnicity: Not Hispanic Or Latino; Race: White Current Smoking Status: Patient does not smoke anymore. Has not smoked since 06/19/2007. Smoked for 25 years. Smoked 1/2 pack per day.   Tobacco Use  Assessment Completed: Used Tobacco in last 30 days? Does drink.  Drinks 2 caffeinated drinks per day. Patient's occupation Civil engineer, contracting.    REVIEW OF SYSTEMS:    GU Review Male:   Patient reports frequent urination and burning/ pain with urination. Patient denies hard to postpone urination, get up at night to urinate, leakage of urine, stream starts and stops, trouble starting your stream, have to strain to urinate , erection problems, and penile pain.  Gastrointestinal (Upper):   Patient denies nausea, vomiting, and indigestion/ heartburn.  Gastrointestinal (Lower):   Patient denies diarrhea and constipation.  Constitutional:   Patient denies fever, night sweats, weight loss, and fatigue.  Skin:   Patient denies skin rash/ lesion and itching.  Eyes:   Patient denies blurred vision and double vision.  Ears/ Nose/ Throat:   Patient denies sore throat and sinus problems.  Hematologic/Lymphatic:   Patient denies swollen glands and easy bruising.  Cardiovascular:   Patient denies leg swelling and chest pains.  Respiratory:   Patient denies cough and shortness of breath.  Endocrine:   Patient denies excessive thirst.  Musculoskeletal:   Patient denies back pain and joint pain.  Neurological:   Patient denies headaches and dizziness.  Psychologic:   Patient denies depression and anxiety.   VITAL SIGNS:      11/21/2017 03:42 PM  Weight 190 lb / 86.18 kg  Height 71 in / 180.34 cm  BP 159/88 mmHg  Heart Rate 78 /min  Temperature 98.0 F / 36.6 C  BMI 26.5 kg/m   MULTI-SYSTEM PHYSICAL EXAMINATION:    Constitutional: Well-nourished. No physical deformities. Normally developed. Good grooming.  Respiratory: No labored breathing, no use of accessory muscles. Normal breath sounds.  Cardiovascular: Regular rate and rhythm. No murmur, no gallop. Normal temperature, normal extremity pulses, no swelling, no varicosities.  Gastrointestinal: No mass, no tenderness, no rigidity, non obese abdomen.   Musculoskeletal: Normal gait and station of head and neck.     PAST DATA REVIEWED:  Source Of History:  Patient  Lab Test Review:   PSA  Records Review:   Pathology Reports, Previous Doctor Records, Previous Patient Records, POC Tool  Urine Test Review:   Urinalysis   08/16/17 06/29/17 12/29/05  PSA  Total PSA 12.40 ng/mL 9.27 ng/mL 2.60   Free PSA   0.32   % Free PSA   12.3     PROCEDURES: None   ASSESSMENT:      ICD-10 Details  1 GU:   Prostate Cancer - C61    PLAN:           Orders         Schedule X-Rays: 1 Week - Chest X-Ray Outside Without Contrast          Document Letter(s):  Created for Patient: Clinical Summary    The patient was counseled about the natural history of prostate cancer and the standard treatment options that are available for prostate cancer. It was explained to him how his age and life  expectancy, clinical stage, Gleason score, and PSA affect his prognosis, the decision to proceed with additional staging studies, as well as how that information influences recommended treatment strategies. We discussed the roles for active surveillance, radiation therapy, surgical therapy, androgen deprivation, as well as ablative therapy options for the treatment of prostate cancer as appropriate to his individual cancer situation. We discussed the risks and benefits of these options with regard to their impact on cancer control and also in terms of potential adverse events, complications, and impact on quality of life particularly related to urinary, bowel, and sexual function. The patient was encouraged to ask questions throughout the discussion today and all questions were answered to his stated satisfaction. In addition, the patient was provided with and/or directed to appropriate resources and literature for further education about prostate cancer and treatment options.   We discussed prostatectomy and specifically robotic prostatectomy with bilateral pelvic  lymphadenectomy. I showed the patient on their abdomen the approximately 6 small incision (trocar) sites as well as presumed extraction sites with robotic approach as well as possible open incision sites should open conversion be necessary. We discussed peri-operative risks including bleeding, infection, deep vein thrombosis, pulmonary embolism, compartment syndrome, neuropathy / neuropraxia, heart attack, stroke, death, as well as long-term risks such as non-cure / need for additional therapy. We specifically addressed that the procedure would compromise urinary control leading to stress incontinence which typically resolves with time and pelvic rehabilitation (Kegel's, etc..), but can sometimes be permanent and require additional therapy including surgery. We also specifically addressed sexual side-effects including significant erectile dysfunction which typically partially resolves with time but can also be permanent and require additional therapy including surgery.   We discussed the typical hospital course including usual 1-2 night hospitalization, discharge with foley catheter in place usually for 1-2 weeks before voiding trial as well as usually 2 week recovery until able to perform most non-strenuous activity and 6 weeks until able to return to most jobs and more strenuous activity such as exercise.         Notes:   The patient has high-volume high-risk prostate cancer. I discussed the treatment options for him including surgery or radiation with androgen deprivation therapy. I went through all the different treatment options in detail. Ultimately, we did discuss a non-nerve sparing radical prostatectomy and extended bilateral lymph node dissection. I think this is the best option for him given his age and severity of disease. Fortunately, he is otherwise quite healthy. Patient will contact us once he is ready to schedule surgery.

## 2018-01-11 NOTE — Anesthesia Procedure Notes (Signed)
Procedure Name: Intubation Date/Time: 01/11/2018 7:29 AM Performed by: Victoriano Lain, CRNA Pre-anesthesia Checklist: Patient identified, Emergency Drugs available, Suction available, Patient being monitored and Timeout performed Patient Re-evaluated:Patient Re-evaluated prior to induction Oxygen Delivery Method: Circle system utilized Preoxygenation: Pre-oxygenation with 100% oxygen Induction Type: IV induction Ventilation: Mask ventilation without difficulty Laryngoscope Size: Glidescope and 4 Grade View: Grade I Number of attempts: 2 Airway Equipment and Method: Video-laryngoscopy and Stylet Dental Injury: Teeth and Oropharynx as per pre-operative assessment  Difficulty Due To: Difficulty was anticipated and Difficult Airway- due to anterior larynx Comments: Smooth IV induction. Easy mask. DL X 1 with MAC 4. Grade 3 view. - ETCO2. ETT removed. VSS. Sats 99 %. Easy mask. DL X 1 with Glidescope #4. Grade 1 view. ATOI. + ETCO2. BBS =. ETT secured at 23 cm at the lip. Difficulty due to anterior larynx.

## 2018-01-11 NOTE — Transfer of Care (Signed)
Immediate Anesthesia Transfer of Care Note  Patient: Jeffery Herrera  Procedure(s) Performed: XI ROBOTIC ASSISTED LAPAROSCOPIC RADICAL PROSTATECTOMY (N/A ) LYMPHADENECTOMY (Bilateral )  Patient Location: PACU  Anesthesia Type:General  Level of Consciousness: awake, alert , oriented and patient cooperative  Airway & Oxygen Therapy: Patient Spontanous Breathing and Patient connected to face mask oxygen  Post-op Assessment: Report given to RN, Post -op Vital signs reviewed and stable and Patient moving all extremities  Post vital signs: Reviewed and stable  Last Vitals:  Vitals Value Taken Time  BP    Temp    Pulse    Resp    SpO2      Last Pain:  Vitals:   01/11/18 0603  TempSrc:   PainSc: 0-No pain      Patients Stated Pain Goal: 4 (57/[id number])  Complications: No apparent anesthesia complications

## 2018-01-12 DIAGNOSIS — C61 Malignant neoplasm of prostate: Secondary | ICD-10-CM | POA: Diagnosis not present

## 2018-01-12 LAB — BASIC METABOLIC PANEL
Anion gap: 5 (ref 5–15)
BUN: 12 mg/dL (ref 6–20)
CHLORIDE: 105 mmol/L (ref 98–111)
CO2: 28 mmol/L (ref 22–32)
CREATININE: 0.8 mg/dL (ref 0.61–1.24)
Calcium: 8.6 mg/dL — ABNORMAL LOW (ref 8.9–10.3)
GFR calc Af Amer: >60 mL/min (ref >60)
GFR calc non Af Amer: >60 mL/min (ref >60)
GLUCOSE: 113 mg/dL — AB (ref 70–99)
Potassium: 4.1 mmol/L (ref 3.5–5.1)
Sodium: 138 mmol/L (ref 135–145)

## 2018-01-12 LAB — HEMOGLOBIN AND HEMATOCRIT, BLOOD
HCT: 35.1 % — ABNORMAL LOW (ref 39.0–52.0)
Hemoglobin: 11.9 g/dL — ABNORMAL LOW (ref 13.0–17.0)

## 2018-01-12 LAB — CREATININE, FLUID (PLEURAL, PERITONEAL, JP DRAINAGE): Creat, Fluid: 0.9 mg/dL

## 2018-01-12 MED ORDER — ACETAMINOPHEN 325 MG PO TABS: 650.0000 mg | ORAL_TABLET | ORAL | Status: DC | PRN

## 2018-01-12 NOTE — Progress Notes (Signed)
1 Day Post-Op Subjective: The patient is doing well.  No nausea or vomiting. Pain is adequately controlled.  Objective: Vital signs in last 24 hours: Temp:  [97.9 F (36.6 C)-98.6 F (37 C)] 98.3 F (36.8 C) (06/27 0432) Pulse Rate:  [55-99] 55 (06/27 0432) Resp:  [8-20] 12 (06/27 0432) BP: (119-154)/(72-87) 119/74 (06/27 0432) SpO2:  [97 %-100 %] 98 % (06/27 0432)  Intake/Output from previous day: 06/26 0701 - 06/27 0700 In: 4568.3 [P.O.:360; I.V.:3495; IV Piggyback:713.3] Out: 3350 [Urine:2950; Drains:200; Blood:200] Intake/Output this shift: No intake/output data recorded.  Physical Exam:  General: Alert and oriented. GI: Soft, Nondistended. Incisions: Dressings intact. Urine: Clear Extremities: Nontender, no erythema, no edema.  Lab Results: Recent Labs    01/11/18 1232 01/12/18 0506  HGB 14.3 11.9*  HCT 42.9 35.1*   Recent Labs    01/12/18 0506  NA 138  K 4.1  CL 105  CO2 28  GLUCOSE 113*  BUN 12  CREATININE 0.80  CALCIUM 8.6*      Assessment/Plan: POD# 1 s/p robotic prostatectomy.  1) SL IVF 2) Ambulate, Incentive spirometry 3) Transition to oral pain medication 4) check JP drain for urine leak. 6) Plan for likely discharge later today   LOS: 0 days   Ardis Hughs 01/12/2018, 7:40 AM

## 2018-01-12 NOTE — Discharge Summary (Signed)
[facility] Discharge Summary  Admit date: 01/11/2018  Discharge date and time: 01/12/18   Discharge to: Home  Discharge Service: Urology  Discharge Attending Physician:  Dr. Louis Meckel  Discharge  Diagnoses: <principal problem not specified>  Secondary Diagnosis: Active Problems:   Prostate cancer [facility])   OR Procedures: Procedure(s): XI ROBOTIC ASSISTED LAPAROSCOPIC RADICAL PROSTATECTOMY LYMPHADENECTOMY 01/11/2018   Ancillary Procedures: None   Discharge Day Services: The patient was seen and examined by the Urology team both in the morning and immediately prior to discharge.  Vital signs and laboratory values were stable and within normal limits.  The physical exam was benign and unchanged and all surgical wounds were examined.  Discharge instructions were explained and all questions answered.  Subjective  No acute events overnight. Pain Controlled. No fever or chills.  Objective Patient Vitals for the past 8 hrs:  BP Temp Temp src Pulse Resp SpO2  01/12/18 1337 119/78 97.8 F (36.6 C) Oral (!) 58 16 99 %  01/12/18 0944 124/78 97.8 F (36.6 C) Oral 62 16 98 %   Total I/O In: 460 [P.O.:460] Out: 245 [Urine:200; Drains:45]  General Appearance:        No acute distress Lungs:                       Normal work of breathing on room air Heart:                                Regular rate and rhythm Abdomen:                         Soft, non-tender, non-distended, incisions c/d/i GU:        Foley catheter draining clear yellow urine Extremities:                      Warm and well perfused   Hospital Course:  The patient underwent RALP on 01/11/2018.  The patient tolerated the procedure well, was extubated in the OR, and afterwards was taken to the PACU for routine post-surgical care. When stable the patient was transferred to the floor.   The patient did well postoperatively.  The patient's diet was slowly advanced and at the time of discharge was tolerating a  regular diet.  The patient was discharged home 1 Day Post-Op, at which point was tolerating an oral diet, have adequate pain control with P.O. pain medication, and could ambulate without difficulty. The patient will follow up with [place] for post op check.   Condition at Discharge: Improved  Discharge Medications:  Allergies as of 01/12/2018   No Known Allergies     Medication List    STOP taking these medications   alfuzosin 10 MG 24 hr tablet Commonly known as:  UROXATRAL   B-6 PO     TAKE these medications   acetaminophen 325 MG tablet Commonly known as:  TYLENOL Take 2 tablets (650 mg total) by mouth every 4 (four) hours as needed for mild pain or headache (or temperature > 101 F).   sulfamethoxazole-trimethoprim 800-160 MG tablet Commonly known as:  BACTRIM DS,SEPTRA DS Take 1 tablet by mouth 2 (two) times daily. Start the day prior to foley removal appointment   traMADol 50 MG tablet Commonly known as:  ULTRAM Take 1-2 tablets (50-100 mg total) by mouth every 6 (six) hours as needed for  moderate pain or severe pain.

## 2018-04-06 ENCOUNTER — Telehealth: Payer: Self-pay | Admitting: Oncology

## 2018-04-06 ENCOUNTER — Encounter: Payer: Self-pay | Admitting: Oncology

## 2018-04-06 NOTE — Telephone Encounter (Signed)
New referral received from Dr. Louis Meckel at [facility] for met prostate cancer. Pt has been cld and scheduled to see Dr. Alen Blew on 10/2 at 11am. Pt aware to arrive 30 minutes early. Address verified. Letter mailed.

## 2018-04-19 ENCOUNTER — Telehealth: Payer: Self-pay | Admitting: Oncology

## 2018-04-19 ENCOUNTER — Inpatient Hospital Stay: Payer: No Typology Code available for payment source | Attending: Oncology | Admitting: Oncology

## 2018-04-19 VITALS — BP 118/83 | HR 64 | Temp 98.2°F | Resp 17 | Ht 71.0 in | Wt 176.5 lb

## 2018-04-19 DIAGNOSIS — Z923 Personal history of irradiation: Secondary | ICD-10-CM

## 2018-04-19 DIAGNOSIS — R9721 Rising PSA following treatment for malignant neoplasm of prostate: Secondary | ICD-10-CM

## 2018-04-19 DIAGNOSIS — Z79899 Other long term (current) drug therapy: Secondary | ICD-10-CM

## 2018-04-19 DIAGNOSIS — C7919 Secondary malignant neoplasm of other urinary organs: Secondary | ICD-10-CM | POA: Diagnosis not present

## 2018-04-19 DIAGNOSIS — I10 Essential (primary) hypertension: Secondary | ICD-10-CM

## 2018-04-19 DIAGNOSIS — D649 Anemia, unspecified: Secondary | ICD-10-CM

## 2018-04-19 DIAGNOSIS — E876 Hypokalemia: Secondary | ICD-10-CM

## 2018-04-19 DIAGNOSIS — C61 Malignant neoplasm of prostate: Secondary | ICD-10-CM | POA: Diagnosis not present

## 2018-04-19 DIAGNOSIS — R42 Dizziness and giddiness: Secondary | ICD-10-CM

## 2018-04-19 DIAGNOSIS — R590 Localized enlarged lymph nodes: Secondary | ICD-10-CM

## 2018-04-19 DIAGNOSIS — Z79818 Long term (current) use of other agents affecting estrogen receptors and estrogen levels: Secondary | ICD-10-CM | POA: Diagnosis not present

## 2018-04-19 DIAGNOSIS — K5900 Constipation, unspecified: Secondary | ICD-10-CM

## 2018-04-19 NOTE — Progress Notes (Signed)
Reason for the request:    Prostate cancer  HPI: I was asked by Dr. Louis Herrera  to evaluate Jeffery Herrera for prostate cancer.  56 year old gentleman native of [place] and currently lives in this area for the last 17 years.  He is a rather healthy gentleman without any significant past medical history until he presented earlier this year with lower urinary tract symptoms.  He complained of frequency, urgency and dysuria.  At that time his PSA was evaluated by Dr. Louis Herrera and was 9.1.  Subsequently was elevated to a level 12.1.  He underwent a prostate biopsy which showed a prostate cancer at the time.  He subsequently underwent a radical prostatectomy on January 11, 2018.  The final pathology showed Gleason score 4+5 = 9 with extensive extraprostatic extension, tumor invasion into the seminal vesicle with 19 out of 29 lymph nodes were involved with cancer.  The final pathological staging was T3bN1.  His PSA 6 weeks later remained persistently high at 2.5 and a repeat to PSA obtained on April 18, 2018 is currently pending.  He is currently recovering reasonably well with improvement in his continence.  He denies any abdominal pain or discomfort.  He denies any dysuria or hematuria.  His nocturia have also improved at this time.  He remains active and continues to attends to activities of daily living.  He does not report any headaches, blurry vision, syncope or seizures. Does not report any fevers, chills or sweats.  Does not report any cough, wheezing or hemoptysis.  Does not report any chest pain, palpitation, orthopnea or leg edema.  Does not report any nausea, vomiting or abdominal pain.  Does not report any constipation or diarrhea.  Does not report any skeletal complaints.    Does not report frequency, urgency or hematuria.  Does not report any skin rashes or lesions. Does not report any heat or cold intolerance.  Does not report any lymphadenopathy or petechiae.  Does not report any anxiety or depression.   Remaining review of systems is negative.    Past Medical History:  Diagnosis Date  . Numbness and tingling in left hand    had since having surgery in left shoulder   :  Past Surgical History:  Procedure Laterality Date  . LYMPHADENECTOMY Bilateral 01/11/2018   Procedure: LYMPHADENECTOMY;  Surgeon: Ardis Hughs, MD;  Location: [facility];  Service: Urology;  Laterality: Bilateral;  . ROBOT ASSISTED LAPAROSCOPIC RADICAL PROSTATECTOMY N/A 01/11/2018   Procedure: XI ROBOTIC ASSISTED LAPAROSCOPIC RADICAL PROSTATECTOMY;  Surgeon: Ardis Hughs, MD;  Location: [facility];  Service: Urology;  Laterality: N/A;  . SHOULDER ARTHROSCOPY Left   :   Current Outpatient Medications:  .  acetaminophen (TYLENOL) 325 MG tablet, Take 2 tablets (650 mg total) by mouth every 4 (four) hours as needed for mild pain or headache (or temperature > 101 F)., Disp: , Rfl:  .  sulfamethoxazole-trimethoprim (BACTRIM DS,SEPTRA DS) 800-160 MG tablet, Take 1 tablet by mouth 2 (two) times daily. Start the day prior to foley removal appointment, Disp: 6 tablet, Rfl: 0 .  traMADol (ULTRAM) 50 MG tablet, Take 1-2 tablets (50-100 mg total) by mouth every 6 (six) hours as needed for moderate pain or severe pain., Disp: 30 tablet, Rfl: 0:  No Known Allergies:  No family history on file.:  Social History   Socioeconomic History  . Marital status: Married    Spouse name: Not on file  . Number of children: Not on file  . Years  of education: Not on file  . Highest education level: Not on file  Occupational History  . Not on file  Social Needs  . Financial resource strain: Not on file  . Food insecurity:    Worry: Not on file    Inability: Not on file  . Transportation needs:    Medical: Not on file    Non-medical: Not on file  Tobacco Use  . Smoking status: Former Smoker    Years: 20.00    Types: Cigarettes  . Smokeless tobacco: Never Used  . Tobacco comment: quit 10 years ago   Substance and Sexual  Activity  . Alcohol use: Yes    Comment: 1-2- beers a night 2-3 times a week   . Drug use: Never  . Sexual activity: Not on file  Lifestyle  . Physical activity:    Days per week: Not on file    Minutes per session: Not on file  . Stress: Not on file  Relationships  . Social connections:    Talks on phone: Not on file    Gets together: Not on file    Attends religious service: Not on file    Active member of club or organization: Not on file    Attends meetings of clubs or organizations: Not on file    Relationship status: Not on file  . Intimate partner violence:    Fear of current or ex partner: Not on file    Emotionally abused: Not on file    Physically abused: Not on file    Forced sexual activity: Not on file  Other Topics Concern  . Not on file  Social History Narrative  . Not on file  :  Pertinent items are noted in HPI.  Exam: Blood pressure 118/83, pulse 64, temperature 98.2 F (36.8 C), temperature source Oral, resp. rate 17, height 5\' 11"  (1.803 m), weight 176 lb 8 oz (80.1 kg), SpO2 98 %.   General appearance: alert and cooperative appeared without distress. Head: atraumatic without any abnormalities. Eyes: conjunctivae/corneas clear. PERRL.  Sclera anicteric. Throat: lips, mucosa, and tongue normal; without oral thrush or ulcers. Resp: clear to auscultation bilaterally without rhonchi, wheezes or dullness to percussion. Cardio: regular rate and rhythm, S1, S2 normal, no murmur, click, rub or gallop GI: soft, non-tender; bowel sounds normal; no masses,  no organomegaly Skin: Skin color, texture, turgor normal. No rashes or lesions Lymph nodes: Cervical, supraclavicular, and axillary nodes normal. Neurologic: Grossly normal without any motor, sensory or deep tendon reflexes. Musculoskeletal: No joint deformity or effusion.  CBC    Component Value Date/Time   WBC 6.0 01/05/2018 1434   RBC 4.65 01/05/2018 1434   HGB 11.9 (L) 01/12/2018 0506   HCT 35.1  (L) 01/12/2018 0506   PLT 219 01/05/2018 1434   MCV 90.5 01/05/2018 1434   MCH 31.0 01/05/2018 1434   MCHC 34.2 01/05/2018 1434   RDW 13.5 01/05/2018 1434     Chemistry      Component Value Date/Time   NA 138 01/12/2018 0506   K 4.1 01/12/2018 0506   CL 105 01/12/2018 0506   CO2 28 01/12/2018 0506   BUN 12 01/12/2018 0506   CREATININE 0.80 01/12/2018 0506      Component Value Date/Time   CALCIUM 8.6 (L) 01/12/2018 0506   ALKPHOS 58 01/05/2018 1434   AST 25 01/05/2018 1434   ALT 27 01/05/2018 1434   BILITOT 0.5 01/05/2018 1434       Assessment and  Plan:   57 year old man with the following:  1.  Prostate cancer diagnosed in April 2019.  At that time his Gleason score was 4+4 = 8 with 12 cores involvement in his prostate biopsy.  His staging work-up did not show any advanced malignancy and subsequently underwent a radical prostatectomy on January 11, 2018.  The final pathology showed a T3BN1 prostate cancer.  His Gleason score was 4+5 = 9 with extraprostatic extension and 19 out of 29 lymph nodes were involved.  His PSA on February 17, 2018 was 2.9.  The natural course of this disease was reviewed today with the patient in detail.  His case was discussed today in the prostate cancer multidisciplinary conference and treatment options were reviewed.  To fully understand his disease status, he has a repeat PSA in October 1 that is currently pending.  We will also require additional imaging study for staging purposes which will be scheduled in the future.  Depending on these findings, his disease appears to be systemic with need for systemic treatment.  The backbone for treating advanced prostate cancer in the setting is androgen deprivation.  Complication associated with this treatment include fatigue, hot flashes, weight gain and sexual dysfunction.  The role for additional therapy such as systemic chemotherapy or second line hormone therapy may be needed depending on his volume of disease  on repeat imaging studies.  We also discussed the role of radiation therapy given his extraprostatic extension.  I fear that his disease is more systemic and local radiation can be reserved at this time if he develops local symptoms in the future.  After discussion today, the plan is to repeat staging work-up in November 2019 and I urged him to start androgen deprivation under the care of Dr. Louis Herrera in the immediate future.  All his questions were answered to his satisfaction.  2.  Follow-up: We will be in November based on his wishes as he is traveling to [place] for work-related assignment.  60  minutes was spent with the patient face-to-face today.  More than 50% of time was dedicated to patient counseling, education and discussing the natural course of his disease and treatment options.     Thank you for the referral.  A copy of this consult has been forwarded to the requesting physician.

## 2018-04-19 NOTE — Telephone Encounter (Signed)
Appts scheduled avs/calendar printed per 10/2 los °

## 2018-05-08 ENCOUNTER — Telehealth: Payer: Self-pay

## 2018-05-08 NOTE — Telephone Encounter (Signed)
Spoke with patient concerning his upcoming appointment with provider and MRI. Patient canceled due to he was unsure if he was going to be in [place]. Per 10/21 phone msg return calls.

## 2018-05-29 ENCOUNTER — Ambulatory Visit: Payer: No Typology Code available for payment source | Admitting: Oncology

## 2018-06-06 ENCOUNTER — Telehealth: Payer: Self-pay | Admitting: Oncology

## 2018-06-06 NOTE — Telephone Encounter (Signed)
Faxed office notes from 04/19/18 to [facility] to [phone]. Release ID# [id number]

## 2020-03-20 IMAGING — NM NM BONE WHOLE BODY
2 series · 2 of 2 positions shown · non-contrast
Comparison: None in PACs

CLINICAL DATA: Prostate malignancy. No current skeletal complaints.
Left shoulder surgery 1 year ago.

EXAM:
NUCLEAR MEDICINE WHOLE BODY BONE SCAN
TECHNIQUE: Whole body anterior and posterior images were obtained approximately
3 hours after intravenous injection of radiopharmaceutical.
RADIOPHARMACEUTICALS:  19.5 mCi Aechnetium-WWm MDP IV

[Series 1: whole body · 2.66mm/px · 1 of 1 slices shown (1 of 2)]
[im 1/1]
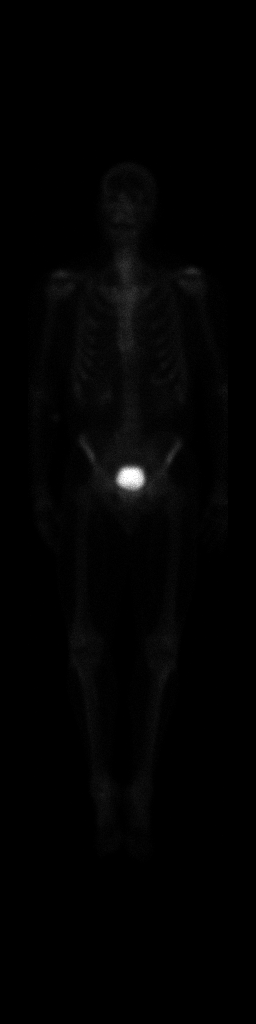

[Series 1: whole body · 2.66mm/px · 1 of 1 slices shown (2 of 2)]
[im 1/1]
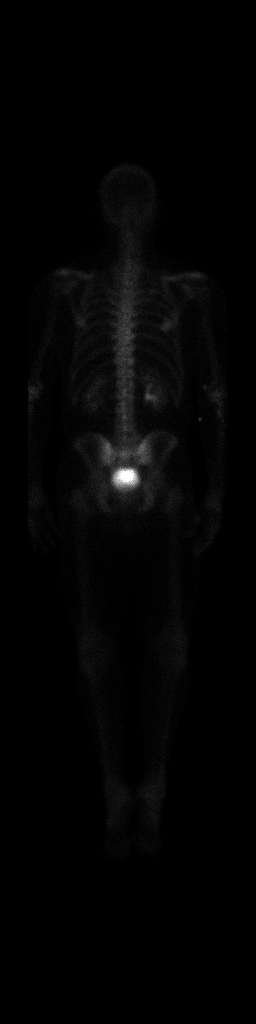

[2 of 2 positions shown; findings below may reference images not displayed]

FINDINGS: There is adequate uptake of the radiopharmaceutical by the skeleton.
There is adequate soft tissue clearance and renal activity. Uptake
within the calvarium, spine, and upper extremities is normal. There
is a tiny focus of increased uptake in the posterior aspect of the
right fourth rib. Uptake within the pelvis and lower extremities is
normal.
IMPRESSION: Faint area of increased uptake in the posterior aspect of
approximately the right fourth rib. This should be visible on a
chest x-ray if there is a pathologic lesion here. Thus a PA chest
x-ray is recommended.
